# Patient Record
Sex: Male | Born: 2000 | Race: Black or African American | Hispanic: No | Marital: Single | Smoking: Never smoker
Health system: Southern US, Community
[De-identification: ages and names within clinical notes are randomized; demographics above are authoritative.]

## PROBLEM LIST (undated history)

## (undated) DIAGNOSIS — L309 Dermatitis, unspecified: Secondary | ICD-10-CM

## (undated) DIAGNOSIS — J45909 Unspecified asthma, uncomplicated: Secondary | ICD-10-CM

## (undated) DIAGNOSIS — T7840XA Allergy, unspecified, initial encounter: Secondary | ICD-10-CM

## (undated) DIAGNOSIS — Z87898 Personal history of other specified conditions: Secondary | ICD-10-CM

## (undated) HISTORY — DX: Personal history of other specified conditions: Z87.898

## (undated) HISTORY — DX: Allergy, unspecified, initial encounter: T78.40XA

## (undated) HISTORY — DX: Dermatitis, unspecified: L30.9

## (undated) HISTORY — DX: Unspecified asthma, uncomplicated: J45.909

---
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
## 2022-01-02 ENCOUNTER — Other Ambulatory Visit: Payer: Self-pay | Admitting: Pediatrics

## 2022-01-08 DIAGNOSIS — M25522 Pain in left elbow: Secondary | ICD-10-CM | POA: Diagnosis not present

## 2022-02-02 ENCOUNTER — Ambulatory Visit: Payer: Medicaid Other | Attending: Orthopedic Surgery | Admitting: Physical Therapy

## 2022-02-02 DIAGNOSIS — M25622 Stiffness of left elbow, not elsewhere classified: Secondary | ICD-10-CM | POA: Insufficient documentation

## 2022-02-02 DIAGNOSIS — M6281 Muscle weakness (generalized): Secondary | ICD-10-CM | POA: Diagnosis present

## 2022-02-02 DIAGNOSIS — G8929 Other chronic pain: Secondary | ICD-10-CM | POA: Diagnosis present

## 2022-02-02 DIAGNOSIS — M25612 Stiffness of left shoulder, not elsewhere classified: Secondary | ICD-10-CM | POA: Insufficient documentation

## 2022-02-02 DIAGNOSIS — M25512 Pain in left shoulder: Secondary | ICD-10-CM | POA: Diagnosis present

## 2022-02-02 NOTE — Therapy (Signed)
OUTPATIENT PHYSICAL THERAPY EVALUATION   Patient Name: Patrick Holloway MRN: [id number] DOB:[birth year], 21 y.o., male Today's Date: 02/02/2022   PT End of Session - 02/03/22 1335     Visit Number 1    Number of Visits 13    Date for PT Re-Evaluation 03/16/22    Authorization Type [employer] MCD    PT Start Time 1615    PT Stop Time 1700    PT Time Calculation (min) 45 min    Activity Tolerance Patient tolerated treatment well    Behavior During Therapy WFL for tasks assessed/performed             Past Medical History:  Diagnosis Date   Allergy    seasonal   Asthma    Eczema    History of Erb's palsy    History reviewed. No pertinent surgical history. There are no problems to display for this patient.   PCP: Myles Gip, DO  REFERRING PROVIDER: Jones Broom, MD  REFERRING DIAG: Contracture of elbow  THERAPY DIAG:  Stiffness of left elbow, not elsewhere classified  Stiffness of left shoulder, not elsewhere classified  Chronic left shoulder pain  Muscle weakness (generalized)  Rationale for Evaluation and Treatment Rehabilitation  ONSET DATE: patient reports ongoing for entire life (2004)   SUBJECTIVE:      SUBJECTIVE STATEMENT: Patient states he has some pains in his shoulder and stiffness in his elbow. 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 Patient is right handed. Patient feels like he is limited with activities such as basketball or swimming where he has to stretch his arm out in front or lift and reach.  PERTINENT HISTORY: Erb's palsy  PAIN:  Are you having pain? Yes:  NPRS scale: 0/10 (4/10 pain when lifting with left arm) Pain location: Left shoulder Pain description: Intermittent, pressure, aching Aggravating factors: Sleeping when laying on left shoulder, moving the left shoulder a lot, reaching, lifting Relieving factors: Rest, Tylenol  PRECAUTIONS: None  WEIGHT BEARING RESTRICTIONS No  FALLS:  Has patient fallen in last 6 months?  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  PLOF: Independent  PATIENT GOALS: Improve left elbow extension and LUE strength to improve use of left arm with school related extracurricular activities that involve reaching and lifting   OBJECTIVE:  PATIENT SURVEYS:  Quick Dash 36.4% disability  COGNITION: Overall cognitive status: Within functional limits for tasks assessed     SENSATION: Patient reports sensation deficits of the left arm from Erb's palsy  POSTURE: Rounded shoulders, left shoulder elevated with anterior scapular winging, left arm internally rotated, elbow and wrist flexed  UPPER EXTREMITY ROM:   Active ROM Right eval Left eval  Shoulder flexion 160 60  Shoulder abduction 170 45  Shoulder external rotation 80 80  Elbow flexion 150 150  Elbow extension 0 -60   UPPER EXTREMITY MMT: Patient demonstrates significant global strength deficits of the left shoulder, elbow, and wrist/hand that are consistent with diagnosis of Erb's palsy.   PALPATION:  Non tender to palpation    TODAY'S TREATMENT:  Seated banded elbow flexion and extension using yellow x 10 each Double ER and scap retraction with yellow x 10 Row with yellow x 10 Supine and seated hands clasped shoulder flexion stretch x 5 each Standing step back stretch using chair for shoulder flexion x 5 Self PROM for elbow extension stretch x 5 Supine LLLD stretch for  elbow extension using 2#   PATIENT EDUCATION: Education details: Exam findings, POC, HEP Person educated: Patient Education method: Explanation, Demonstration, Tactile cues, Verbal cues, and Handouts Education comprehension: verbalized understanding, returned demonstration, verbal cues required, tactile cues required, and needs further education  HOME EXERCISE PROGRAM: Access Code: [email or web address]   ASSESSMENT: CLINICAL IMPRESSION: Patient is a 21  y.o. 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 His main impairments include a 60 deg limitation in elbow extension and global strength deficits leading to postural deviations and increased left shoulder pain with activities including lifting and carrying. Patient is a Consulting civil engineer and feels limited with school related extracurricular activities due to his impairments.    OBJECTIVE IMPAIRMENTS decreased ROM, decreased strength, impaired flexibility, impaired sensation, improper body mechanics, postural dysfunction, and pain.   ACTIVITY LIMITATIONS carrying, lifting, and reach over head  PARTICIPATION LIMITATIONS: community activity and school  PERSONAL FACTORS Past/current experiences and Time since onset of injury/illness/exacerbation are also affecting patient's functional outcome.   REHAB POTENTIAL: Good  CLINICAL DECISION MAKING: Stable/uncomplicated  EVALUATION COMPLEXITY: Low   GOALS: Goals reviewed with patient? Yes  SHORT TERM GOALS: Target date: 03/02/2022  Patient will be I with initial HEP in order to progress with therapy. Baseline: HEP provided at eval Goal status: INITIAL  2.  Patient will improve right elbow extension by 5 deg (55 deg deficit) to improve forward reach and indicate improved elbow mobility. Baseline: Lacking 60 deg of elbow extension Goal status: INITIAL  LONG TERM GOALS: Target date: 03/16/2022   Patient will be I with final HEP to maintain progress from PT. Baseline: HEP provided at eval Goal status: INITIAL  2.  Patient will report </= 25% on Quick DASH in order to indicate improvement in functional ability. Baseline: 36.4% Goal status: INITIAL  3.  Patient will improve left elbow AROM to 45-150 degrees in order to improve reaching tasks and participation in school related extracurricular activities. Baseline: Left elbow AROM 60-150 deg Goal  status: INITIAL  4.  Patient will demonstrate improve postural control and report no increased left shoulder pain with lifting or carrying tasks in order to improve ability to lift or carry book bag for school. Baseline: Patient demonstrates rounded shoulder posture and 4/10 left shoulder pain when lifting with the left arm Goal status: INITIAL   PLAN: PT FREQUENCY: 1-2x/week  PT DURATION: 6 weeks  PLANNED INTERVENTIONS: Therapeutic exercises, Therapeutic activity, Neuromuscular re-education, Balance training, Gait training, Patient/Family education, Joint manipulation, Joint mobilization, Aquatic Therapy, Dry Needling, Electrical stimulation, Cryotherapy, Moist heat, Taping, Manual therapy, and Re-evaluation  PLAN FOR NEXT SESSION: Review HEP and progress PRN, manual and stretching for left elbow extension and shoulder flexion, progress strength for postural control and lifting/carrying ability, grip strengthening   Rosana Hoes, PT, DPT, LAT, ATC 02/03/22  4:02 PM Phone: [phone] Fax: [phone]   Check all possible CPT codes: 83419 - PT Re-evaluation, 97110- Therapeutic Exercise, [phone]- Neuro Re-education, 97140 - Manual Therapy, 97530 - Therapeutic Activities, 97535 - Self Care, 97014 - Electrical stimulation (unattended), and [id number] - Electrical stimulation (Manual)     If treatment provided at initial evaluation, no treatment charged due to lack of authorization.

## 2022-02-03 ENCOUNTER — Other Ambulatory Visit: Payer: Self-pay

## 2022-02-03 ENCOUNTER — Encounter: Payer: Self-pay | Admitting: Physical Therapy

## 2022-02-11 ENCOUNTER — Ambulatory Visit: Payer: Medicaid Other | Attending: Orthopedic Surgery

## 2022-02-11 DIAGNOSIS — M25612 Stiffness of left shoulder, not elsewhere classified: Secondary | ICD-10-CM | POA: Diagnosis present

## 2022-02-11 DIAGNOSIS — M25622 Stiffness of left elbow, not elsewhere classified: Secondary | ICD-10-CM | POA: Insufficient documentation

## 2022-02-11 DIAGNOSIS — M25512 Pain in left shoulder: Secondary | ICD-10-CM | POA: Insufficient documentation

## 2022-02-11 DIAGNOSIS — M6281 Muscle weakness (generalized): Secondary | ICD-10-CM | POA: Insufficient documentation

## 2022-02-11 DIAGNOSIS — G8929 Other chronic pain: Secondary | ICD-10-CM | POA: Diagnosis present

## 2022-02-11 NOTE — Therapy (Signed)
OUTPATIENT PHYSICAL THERAPY TREATMENT NOTE   Patient Name: Patrick Holloway MRN: [id number] DOB:Jul 26, 2001, 21 y.o., male Today's Date: 02/11/2022  PCP: Myles Gip, DO REFERRING PROVIDER: Jones Broom, MD  END OF SESSION:   PT End of Session - 02/11/22 1702     Visit Number 2    Number of Visits 13    Date for PT Re-Evaluation 03/16/22    Authorization Type [employer] MCD- auth pending    PT Start Time 1702    PT Stop Time 1743    PT Time Calculation (min) 41 min    Activity Tolerance Patient tolerated treatment well    Behavior During Therapy WFL for tasks assessed/performed             Past Medical History:  Diagnosis Date   Allergy    seasonal   Asthma    Eczema    History of Erb's palsy    History reviewed. No pertinent surgical history. There are no problems to display for this patient.   REFERRING DIAG:  Contracture of elbow  THERAPY DIAG:  Stiffness of left elbow, not elsewhere classified  Stiffness of left shoulder, not elsewhere classified  Chronic left shoulder pain  Muscle weakness (generalized)  Rationale for Evaluation and Treatment Rehabilitation  PERTINENT HISTORY: Erb's Palsy  PRECAUTIONS: none   SUBJECTIVE: Patient reports he is doing well without current reports of pain. He reports compliance with HEP.   PAIN:  Are you having pain? No   OBJECTIVE: (objective measures completed at initial evaluation unless otherwise dated)  PATIENT SURVEYS:  Quick Dash 36.4% disability   COGNITION: Overall cognitive status: Within functional limits for tasks assessed                                  SENSATION: Patient reports sensation deficits of the left arm from Erb's palsy   POSTURE: Rounded shoulders, left shoulder elevated with anterior scapular winging, left arm internally rotated, elbow and wrist flexed   UPPER EXTREMITY ROM:    Active ROM Right eval Left eval 02/11/22  Shoulder flexion 160 60   Shoulder abduction 170 45    Shoulder external rotation 80 80   Elbow flexion 150 150   Elbow extension 0 -60 Lacking 50     UPPER EXTREMITY MMT: Patient demonstrates significant global strength deficits of the left shoulder, elbow, and wrist/hand that are consistent with diagnosis of Erb's palsy.    PALPATION:  Non tender to palpation               TODAY'S TREATMENT:  [facility] Adult PT Treatment:                                                DATE: 02/11/22 Therapeutic Exercise: Elbow flexor stretch at wall 2 x 30 sec  Wall pushup partial range 1 x 10  Resisted pronation/supination with yellow therabar 1 x 10 each  Sidelying shoulder flexion LUE 1 x 10 Supine shoulder flexion with stability ball 1 x 10  Stool rotations with LUE (shoulder IR/ER) 1 x10 each  Pulleys flexion 2 minutes  Childs pose x 60 seconds  Manual Therapy: Lt elbow and shoulder flexion stretching Lt Elbow distraction with strap STM Lt biceps brachii Lt elbow pronation/supination PROM   Initial evaluation treatment  Seated banded elbow flexion and extension using yellow x 10 each Double ER and scap retraction with yellow x 10 Row with yellow x 10 Supine and seated hands clasped shoulder flexion stretch x 5 each Standing step back stretch using chair for shoulder flexion x 5 Self PROM for elbow extension stretch x 5 Supine LLLD stretch for elbow extension using 2#     PATIENT EDUCATION: Education details: N/A Person educated: N/A Education method:N/A Education comprehension: N/A   HOME EXERCISE PROGRAM: Access Code: [email or web address]     ASSESSMENT: CLINICAL IMPRESSION:   Patient tolerated session well today focusing on improving elbow extension ROM. His elbow extension AROM has improved by 10 degrees compared to initial evaluation, though remains significantly limited at this time, lacking 50 degrees. Able to progress shoulder and elbow strengthening without reports of pain, though given his Erb's Palsy, some compensation present with all  strengthening.    OBJECTIVE IMPAIRMENTS decreased ROM, decreased strength, impaired flexibility, impaired sensation, improper body mechanics, postural dysfunction, and pain.    ACTIVITY LIMITATIONS carrying, lifting, and reach over head   PARTICIPATION LIMITATIONS: community activity and school   PERSONAL FACTORS Past/current experiences and Time since onset of injury/illness/exacerbation are also affecting patient's functional outcome.    REHAB POTENTIAL: Good   CLINICAL DECISION MAKING: Stable/uncomplicated   EVALUATION COMPLEXITY: Low     GOALS: Goals reviewed with patient? Yes   SHORT TERM GOALS: Target date: 03/02/2022   Patient will be I with initial HEP in order to progress with therapy. Baseline: HEP provided at eval Goal status: INITIAL   2.  Patient will improve right elbow extension by 5 deg (55 deg deficit) to improve forward reach and indicate improved elbow mobility. Baseline: Lacking 60 deg of elbow extension Goal status: INITIAL   LONG TERM GOALS: Target date: 03/16/2022    Patient will be I with final HEP to maintain progress from PT. Baseline: HEP provided at eval Goal status: INITIAL   2.  Patient will report </= 25% on Quick DASH in order to indicate improvement in functional ability. Baseline: 36.4% Goal status: INITIAL   3.  Patient will improve left elbow AROM to 45-150 degrees in order to improve reaching tasks and participation in school related extracurricular activities. Baseline: Left elbow AROM 60-150 deg Goal status: INITIAL   4.  Patient will demonstrate improve postural control and report no increased left shoulder pain with lifting or carrying tasks in order to improve ability to lift or carry book bag for school. Baseline: Patient demonstrates rounded shoulder posture and 4/10 left shoulder pain when lifting with the left arm Goal status: INITIAL     PLAN: PT FREQUENCY: 1-2x/week   PT DURATION: 6 weeks   PLANNED INTERVENTIONS:  Therapeutic exercises, Therapeutic activity, Neuromuscular re-education, Balance training, Gait training, Patient/Family education, Joint manipulation, Joint mobilization, Aquatic Therapy, Dry Needling, Electrical stimulation, Cryotherapy, Moist heat, Taping, Manual therapy, and Re-evaluation   PLAN FOR NEXT SESSION: Review HEP and progress PRN, manual and stretching for left elbow extension and shoulder flexion, progress strength for postural control and lifting/carrying ability, grip strengthening     Letitia Libra, PT, DPT, ATC 02/11/22 5:45 PM

## 2022-02-15 ENCOUNTER — Ambulatory Visit: Payer: Medicaid Other

## 2022-02-17 ENCOUNTER — Ambulatory Visit: Payer: Medicaid Other

## 2022-02-17 DIAGNOSIS — M25622 Stiffness of left elbow, not elsewhere classified: Secondary | ICD-10-CM

## 2022-02-17 DIAGNOSIS — M6281 Muscle weakness (generalized): Secondary | ICD-10-CM

## 2022-02-17 DIAGNOSIS — M25612 Stiffness of left shoulder, not elsewhere classified: Secondary | ICD-10-CM

## 2022-02-17 DIAGNOSIS — G8929 Other chronic pain: Secondary | ICD-10-CM

## 2022-02-17 NOTE — Patient Instructions (Signed)

## 2022-02-17 NOTE — Therapy (Signed)
OUTPATIENT PHYSICAL THERAPY TREATMENT NOTE   Patient Name: Patrick Holloway MRN: [id number] DOB:05-Mar-2001, 21 y.o., male Today's Date: 02/17/2022  PCP: Myles Gip, DO REFERRING PROVIDER: Jones Broom, MD  END OF SESSION:   PT End of Session - 02/17/22 1616     Visit Number 3    Number of Visits 13    Date for PT Re-Evaluation 03/16/22    Authorization Type [employer] MCD- auth pending    PT Start Time 1616    PT Stop Time 1658    PT Time Calculation (min) 42 min    Activity Tolerance Patient tolerated treatment well    Behavior During Therapy WFL for tasks assessed/performed             Past Medical History:  Diagnosis Date   Allergy    seasonal   Asthma    Eczema    History of Erb's palsy    History reviewed. No pertinent surgical history. There are no problems to display for this patient.   REFERRING DIAG:  Contracture of elbow  THERAPY DIAG:  Stiffness of left elbow, not elsewhere classified  Stiffness of left shoulder, not elsewhere classified  Chronic left shoulder pain  Muscle weakness (generalized)  Rationale for Evaluation and Treatment Rehabilitation  PERTINENT HISTORY: Erb's Palsy  PRECAUTIONS: none   SUBJECTIVE: Patient reports he is doing good without reports of pain. He reports compliance with HEP.   PAIN:  Are you having pain? No   OBJECTIVE: (objective measures completed at initial evaluation unless otherwise dated)  PATIENT SURVEYS:  Quick Dash 36.4% disability   COGNITION: Overall cognitive status: Within functional limits for tasks assessed                                  SENSATION: Patient reports sensation deficits of the left arm from Erb's palsy   POSTURE: Rounded shoulders, left shoulder elevated with anterior scapular winging, left arm internally rotated, elbow and wrist flexed   UPPER EXTREMITY ROM:    Active ROM Right eval Left eval 02/11/22 02/17/22  Shoulder flexion 160 60    Shoulder abduction 170 45     Shoulder external rotation 80 80    Elbow flexion 150 150    Elbow extension 0 -60 Lacking 50  Lacking 45     UPPER EXTREMITY MMT: Patient demonstrates significant global strength deficits of the left shoulder, elbow, and wrist/hand that are consistent with diagnosis of Erb's palsy.    PALPATION:  Non tender to palpation               TODAY'S TREATMENT:  [facility] Adult PT Treatment:                                                DATE: 02/17/22 Therapeutic Exercise: UBE x 4 minutes for ROM (no resistance) Tricep extension with pilates springboard 2 x 10 Stool rotations with LUE (shoulder IR/ER) 1 x10 each  Chest press eccentric with pilates springboard 1 x 10  Manual Therapy: Lt elbow and shoulder flexion/abduction PROM to tolerance  STM/DTM Lt biceps brachii Lt elbow pronation/supination PROM  Trigger Point Dry Needling Treatment: Pre-treatment instruction: Patient instructed on dry needling rationale, procedures, and possible side effects including pain during treatment (achy,cramping feeling), bruising, drop of blood, lightheadedness,  nausea, sweating. Patient Consent Given: Yes Education handout provided: Yes Muscles treated: Lt bicep brachii   Treatment response/outcome: Twitch response elicited and Palpable decrease in muscle tension Post-treatment instructions: Patient instructed to expect possible mild to moderate muscle soreness later today and/or tomorrow. Patient instructed in methods to reduce muscle soreness and to continue prescribed HEP. If patient was dry needled over the lung field, patient was instructed on signs and symptoms of pneumothorax and, however unlikely, to see immediate medical attention should they occur. Patient was also educated on signs and symptoms of infection and to seek medical attention should they occur. Patient verbalized understanding of these instructions and education.   [facility] Adult PT Treatment:                                                 DATE: 02/11/22 Therapeutic Exercise: Elbow flexor stretch at wall 2 x 30 sec  Wall pushup partial range 1 x 10  Resisted pronation/supination with yellow therabar 1 x 10 each  Sidelying shoulder flexion LUE 1 x 10 Supine shoulder flexion with stability ball 1 x 10  Stool rotations with LUE (shoulder IR/ER) 1 x10 each  Pulleys flexion 2 minutes  Childs pose x 60 seconds  Manual Therapy: Lt elbow and shoulder flexion stretching Lt Elbow distraction with strap STM Lt biceps brachii Lt elbow pronation/supination PROM   Initial evaluation treatment  Seated banded elbow flexion and extension using yellow x 10 each Double ER and scap retraction with yellow x 10 Row with yellow x 10 Supine and seated hands clasped shoulder flexion stretch x 5 each Standing step back stretch using chair for shoulder flexion x 5 Self PROM for elbow extension stretch x 5 Supine LLLD stretch for elbow extension using 2#     PATIENT EDUCATION: Education details: N/A Person educated: N/A Education method:N/A Education comprehension: N/A   HOME EXERCISE PROGRAM: Access Code: [email or web address]     ASSESSMENT: CLINICAL IMPRESSION: Patient tolerated session well today with continued emphasis on improving Lt elbow and shoulder mobility and strength. TPDN was performed to the bicep brachii with excellent twitch response elicited. Slight improvement in elbow extension AROM noted today compared to last session. He tolerated ROM and strengthening well today without reports of pain, though muscle compensation is present with majority of strengthening secondary to Erb's Palsy.    OBJECTIVE IMPAIRMENTS decreased ROM, decreased strength, impaired flexibility, impaired sensation, improper body mechanics, postural dysfunction, and pain.    ACTIVITY LIMITATIONS carrying, lifting, and reach over head   PARTICIPATION LIMITATIONS: community activity and school   PERSONAL FACTORS Past/current experiences and Time since onset of  injury/illness/exacerbation are also affecting patient's functional outcome.    REHAB POTENTIAL: Good   CLINICAL DECISION MAKING: Stable/uncomplicated   EVALUATION COMPLEXITY: Low     GOALS: Goals reviewed with patient? Yes   SHORT TERM GOALS: Target date: 03/02/2022   Patient will be I with initial HEP in order to progress with therapy. Baseline: HEP provided at eval Goal status: achieved    2.  Patient will improve right elbow extension by 5 deg (55 deg deficit) to improve forward reach and indicate improved elbow mobility. Baseline: Lacking 60 deg of elbow extension Goal status: achieved    LONG TERM GOALS: Target date: 03/16/2022    Patient will be I with final HEP to maintain progress from PT. Baseline:  HEP provided at eval Goal status: INITIAL   2.  Patient will report </= 25% on Quick DASH in order to indicate improvement in functional ability. Baseline: 36.4% Goal status: INITIAL   3.  Patient will improve left elbow AROM to 45-150 degrees in order to improve reaching tasks and participation in school related extracurricular activities. Baseline: Left elbow AROM 60-150 deg Goal status: INITIAL   4.  Patient will demonstrate improve postural control and report no increased left shoulder pain with lifting or carrying tasks in order to improve ability to lift or carry book bag for school. Baseline: Patient demonstrates rounded shoulder posture and 4/10 left shoulder pain when lifting with the left arm Goal status: INITIAL     PLAN: PT FREQUENCY: 1-2x/week   PT DURATION: 6 weeks   PLANNED INTERVENTIONS: Therapeutic exercises, Therapeutic activity, Neuromuscular re-education, Balance training, Gait training, Patient/Family education, Joint manipulation, Joint mobilization, Aquatic Therapy, Dry Needling, Electrical stimulation, Cryotherapy, Moist heat, Taping, Manual therapy, and Re-evaluation   PLAN FOR NEXT SESSION: Review HEP and progress PRN, manual and stretching  for left elbow extension and shoulder flexion, progress strength for postural control and lifting/carrying ability, grip strengthening     Letitia Libra, PT, DPT, ATC 02/17/22 5:00 PM

## 2022-02-22 ENCOUNTER — Ambulatory Visit: Payer: Medicaid Other | Admitting: Physical Therapy

## 2022-02-23 ENCOUNTER — Telehealth: Payer: Self-pay | Admitting: Physical Therapy

## 2022-02-23 NOTE — Telephone Encounter (Signed)
Attempted to contact patient regarding missed PT appointment. Left VM informing him of missed appointment, next scheduled appointment, and attendance policy.   Rosana Hoes, PT, DPT, LAT, ATC 02/23/22  1:51 PM Phone: [phone] Fax: [phone]

## 2022-02-23 NOTE — Therapy (Addendum)
OUTPATIENT PHYSICAL THERAPY TREATMENT NOTE  PHYSICAL THERAPY DISCHARGE SUMMARY  Visits from Start of Care: 4  Current functional level related to goals / functional outcomes: See goals below   Remaining deficits: Status unknown   Education / Equipment: N/a   Patient agrees to discharge. Patient goals were partially met. Patient is being discharged due to not returning since the last visit.    Patient Name: Patrick Holloway MRN: [id number] DOB:Mar 22, 2001, 21 y.o., male Today's Date: 02/24/2022  PCP: Kristen Loader, DO REFERRING PROVIDER: Tania Ade, MD  END OF SESSION:   PT End of Session - 02/24/22 1551     Visit Number 4    Number of Visits 13    Date for PT Re-Evaluation 03/16/22    Authorization Type [employer] MCD- auth pending    Authorization Time Period 7/6-8/18/23    Authorization - Number of Visits 12    PT Start Time 3419   patient late   PT Stop Time 1615    PT Time Calculation (min) 24 min    Activity Tolerance Patient tolerated treatment well    Behavior During Therapy WFL for tasks assessed/performed              Past Medical History:  Diagnosis Date   Allergy    seasonal   Asthma    Eczema    History of Erb's palsy    History reviewed. No pertinent surgical history. There are no problems to display for this patient.   REFERRING DIAG:  Contracture of elbow  THERAPY DIAG:  Stiffness of left elbow, not elsewhere classified  Stiffness of left shoulder, not elsewhere classified  Chronic left shoulder pain  Muscle weakness (generalized)  Rationale for Evaluation and Treatment Rehabilitation  PERTINENT HISTORY: Erb's Palsy  PRECAUTIONS: none   SUBJECTIVE: Patient reports he is feeling good without pain. He reports completing HEP daily.   PAIN:  Are you having pain? No   OBJECTIVE: (objective measures completed at initial evaluation unless otherwise dated)  PATIENT SURVEYS:  Quick Dash 36.4% disability    COGNITION: Overall cognitive status: Within functional limits for tasks assessed                                  SENSATION: Patient reports sensation deficits of the left arm from Erb's palsy   POSTURE: Rounded shoulders, left shoulder elevated with anterior scapular winging, left arm internally rotated, elbow and wrist flexed   UPPER EXTREMITY ROM:    Active ROM Right eval Left eval 02/11/22 02/17/22 02/24/22  Shoulder flexion 160 60     Shoulder abduction 170 45     Shoulder external rotation 80 80     Elbow flexion 150 150     Elbow extension 0 -60 Lacking 50  Lacking 45  Lacking 45     UPPER EXTREMITY MMT: Patient demonstrates significant global strength deficits of the left shoulder, elbow, and wrist/hand that are consistent with diagnosis of Erb's palsy.    PALPATION:  Non tender to palpation               TODAY'S TREATMENT:  [facility] Adult PT Treatment:                                                DATE: 02/24/22  Therapeutic Exercise: Sidelying ER 2 x 10; 3# LUE Prone shoulder extension 2 x 10 LUE Shoulder taps at wall 1 x 10 Prone T LUE 2 x 10  Updated HEP Manual Therapy: Lt elbow extension and shoulder flexion PROM to tolerance  Lt elbow pronation/supination PROM    [facility] Adult PT Treatment:                                                DATE: 02/17/22 Therapeutic Exercise: UBE x 4 minutes for ROM (no resistance) Tricep extension with pilates springboard 2 x 10 Stool rotations with LUE (shoulder IR/ER) 1 x10 each  Chest press eccentric with pilates springboard 1 x 10  Manual Therapy: Lt elbow and shoulder flexion/abduction PROM to tolerance  STM/DTM Lt biceps brachii Lt elbow pronation/supination PROM  Trigger Point Dry Needling Treatment: Pre-treatment instruction: Patient instructed on dry needling rationale, procedures, and possible side effects including pain during treatment (achy,cramping feeling), bruising, drop of blood, lightheadedness, nausea,  sweating. Patient Consent Given: Yes Education handout provided: Yes Muscles treated: Lt bicep brachii   Treatment response/outcome: Twitch response elicited and Palpable decrease in muscle tension Post-treatment instructions: Patient instructed to expect possible mild to moderate muscle soreness later today and/or tomorrow. Patient instructed in methods to reduce muscle soreness and to continue prescribed HEP. If patient was dry needled over the lung field, patient was instructed on signs and symptoms of pneumothorax and, however unlikely, to see immediate medical attention should they occur. Patient was also educated on signs and symptoms of infection and to seek medical attention should they occur. Patient verbalized understanding of these instructions and education.   [facility] Adult PT Treatment:                                                DATE: 02/11/22 Therapeutic Exercise: Elbow flexor stretch at wall 2 x 30 sec  Wall pushup partial range 1 x 10  Resisted pronation/supination with yellow therabar 1 x 10 each  Sidelying shoulder flexion LUE 1 x 10 Supine shoulder flexion with stability ball 1 x 10  Stool rotations with LUE (shoulder IR/ER) 1 x10 each  Pulleys flexion 2 minutes  Childs pose x 60 seconds  Manual Therapy: Lt elbow and shoulder flexion stretching Lt Elbow distraction with strap STM Lt biceps brachii Lt elbow pronation/supination PROM       PATIENT EDUCATION: Education details: HEP Person educated: patient Education method:demo, handout, cues Education comprehension: returned demo, cues    HOME EXERCISE PROGRAM: Access Code: [email or web address]     ASSESSMENT: CLINICAL IMPRESSION:  Session was limited as patient was late for scheduled visit. Elbow extension AROM remains unchanged from last visit. Focused on shoulder and periscapular strengthening today, which he tolerated well with some compensatory movement present secondary to Erb's palsy. He has the most difficulty with  periscapular strengthening with ability to complete strengthening through partial range.    OBJECTIVE IMPAIRMENTS decreased ROM, decreased strength, impaired flexibility, impaired sensation, improper body mechanics, postural dysfunction, and pain.    ACTIVITY LIMITATIONS carrying, lifting, and reach over head   PARTICIPATION LIMITATIONS: community activity and school   PERSONAL FACTORS Past/current experiences and Time since onset of injury/illness/exacerbation are also affecting patient's functional outcome.  REHAB POTENTIAL: Good   CLINICAL DECISION MAKING: Stable/uncomplicated   EVALUATION COMPLEXITY: Low     GOALS: Goals reviewed with patient? Yes   SHORT TERM GOALS: Target date: 03/02/2022   Patient will be I with initial HEP in order to progress with therapy. Baseline: HEP provided at eval Goal status: achieved    2.  Patient will improve right elbow extension by 5 deg (55 deg deficit) to improve forward reach and indicate improved elbow mobility. Baseline: Lacking 60 deg of elbow extension Goal status: achieved    LONG TERM GOALS: Target date: 03/16/2022    Patient will be I with final HEP to maintain progress from PT. Baseline: HEP provided at eval Goal status: INITIAL   2.  Patient will report </= 25% on Quick DASH in order to indicate improvement in functional ability. Baseline: 36.4% Goal status: INITIAL   3.  Patient will improve left elbow AROM to 45-150 degrees in order to improve reaching tasks and participation in school related extracurricular activities. Baseline: Left elbow AROM 60-150 deg Goal status: INITIAL   4.  Patient will demonstrate improve postural control and report no increased left shoulder pain with lifting or carrying tasks in order to improve ability to lift or carry book bag for school. Baseline: Patient demonstrates rounded shoulder posture and 4/10 left shoulder pain when lifting with the left arm Goal status: INITIAL     PLAN: PT  FREQUENCY: 1-2x/week   PT DURATION: 6 weeks   PLANNED INTERVENTIONS: Therapeutic exercises, Therapeutic activity, Neuromuscular re-education, Balance training, Gait training, Patient/Family education, Joint manipulation, Joint mobilization, Aquatic Therapy, Dry Needling, Electrical stimulation, Cryotherapy, Moist heat, Taping, Manual therapy, and Re-evaluation   PLAN FOR NEXT SESSION: Review HEP and progress PRN, manual and stretching for left elbow extension and shoulder flexion, progress strength for postural control and lifting/carrying ability, grip strengthening     Gwendolyn Grant, PT, DPT, ATC 02/24/22 4:15 PM  Gwendolyn Grant, PT, DPT, ATC 04/29/22 2:39 PM

## 2022-02-24 ENCOUNTER — Ambulatory Visit: Payer: Medicaid Other

## 2022-02-24 DIAGNOSIS — G8929 Other chronic pain: Secondary | ICD-10-CM

## 2022-02-24 DIAGNOSIS — M25612 Stiffness of left shoulder, not elsewhere classified: Secondary | ICD-10-CM

## 2022-02-24 DIAGNOSIS — M25622 Stiffness of left elbow, not elsewhere classified: Secondary | ICD-10-CM

## 2022-02-24 DIAGNOSIS — M6281 Muscle weakness (generalized): Secondary | ICD-10-CM

## 2022-03-12 DIAGNOSIS — H5213 Myopia, bilateral: Secondary | ICD-10-CM | POA: Diagnosis not present

## 2022-03-22 ENCOUNTER — Encounter: Payer: Self-pay | Admitting: Pediatrics

## 2022-03-30 DIAGNOSIS — Z23 Encounter for immunization: Secondary | ICD-10-CM | POA: Diagnosis not present

## 2022-05-20 ENCOUNTER — Ambulatory Visit (INDEPENDENT_AMBULATORY_CARE_PROVIDER_SITE_OTHER): Payer: Medicaid Other | Admitting: Orthopaedic Surgery

## 2022-05-20 ENCOUNTER — Encounter: Payer: Self-pay | Admitting: Orthopaedic Surgery

## 2022-05-20 ENCOUNTER — Ambulatory Visit (INDEPENDENT_AMBULATORY_CARE_PROVIDER_SITE_OTHER): Payer: Medicaid Other

## 2022-05-20 DIAGNOSIS — G8929 Other chronic pain: Secondary | ICD-10-CM

## 2022-05-20 DIAGNOSIS — M25512 Pain in left shoulder: Secondary | ICD-10-CM

## 2022-05-20 NOTE — Progress Notes (Signed)
The patient is a 21 year old gentleman sent from his primary care physician to evaluate and treat a chronic left shoulder issue.  His mother is with him today as well.  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  Apparently he was taken to surgery about 9 months later for surgery on the left upper extremity as it relates to his Erb's palsy.  He actually has a plate and screws in the clavicle from that surgery on the left side.  He now does have chronic pain around his shoulder and clavicle area and has been going to physical therapy but he does report painful range of motion of the left shoulder and weakness in general of his left upper extremity which is chronic.  He is right-hand dominant.  He is a Ship broker at [employer].  He denies any other significant medical issues at all.  I had him remove his shirt on inspection he can see that his left upper extremity is smaller than the right upper extremity.  The shoulder is clinically well located and his external rotation is almost full.  His abduction as well as reaching behind him is limited.  He has significant scarring around his clavicle from previous surgery.  He has a weak grip and pinch strength on the left side.  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  X-rays of his shoulder show the shoulder is well located on the left side and there is a retained clavicle plate and screws.  I talked to the patient and his mother about his situation.  This is significantly complex given his history of Erb's palsy and the chronic issues he has as the result of that birth injury.  This is something that is complex enough that I will would like to send him to Dr. Roberts Gaudy at [facility] orthopedics for further evaluation and treatment of this complex upper extremity issue.  The patient and his mother agree with this referral as well.

## 2022-05-21 ENCOUNTER — Other Ambulatory Visit: Payer: Self-pay

## 2022-05-21 DIAGNOSIS — G8929 Other chronic pain: Secondary | ICD-10-CM

## 2022-06-28 DIAGNOSIS — M24522 Contracture, left elbow: Secondary | ICD-10-CM | POA: Diagnosis not present

## 2022-06-28 DIAGNOSIS — G8929 Other chronic pain: Secondary | ICD-10-CM | POA: Diagnosis not present

## 2022-06-28 DIAGNOSIS — M25512 Pain in left shoulder: Secondary | ICD-10-CM | POA: Diagnosis not present

## 2022-07-14 ENCOUNTER — Ambulatory Visit (INDEPENDENT_AMBULATORY_CARE_PROVIDER_SITE_OTHER): Payer: Medicaid Other | Admitting: Orthopaedic Surgery

## 2022-07-14 ENCOUNTER — Encounter: Payer: Self-pay | Admitting: Orthopaedic Surgery

## 2022-07-14 DIAGNOSIS — M25512 Pain in left shoulder: Secondary | ICD-10-CM

## 2022-07-14 DIAGNOSIS — G8929 Other chronic pain: Secondary | ICD-10-CM

## 2022-07-14 NOTE — Progress Notes (Signed)
The patient is a 21 year old male that I saw her recently.  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  He had surgery by a nerve specialist Dr. Laurice Record at [facility] when he was very young.  I recently sent him to a nerve and shoulder specialist over at [facility] and according to his mother, there is really nothing that they can do for him and said that he is actually doing well for what he had been through it is a chronic issue.  They had no recommendations.  She comes in today requesting that he qualify for full body disability for his shoulder issues.  I explained to her that it would be difficult for him to get full body disability given that he has normal function of his right upper extremity as well as his legs and feet.  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  He has normal mental capacity.  I explained that it would be difficult to obtain full body disability because we could not say that he has no ability to perform any type of even light physical demand labor or sedentary labor.  He is not a candidate for full body disability from an orthopedic standpoint but this is something she can certainly pursue with his primary care physician.

## 2022-07-19 DIAGNOSIS — L308 Other specified dermatitis: Secondary | ICD-10-CM | POA: Diagnosis not present

## 2022-07-19 DIAGNOSIS — L700 Acne vulgaris: Secondary | ICD-10-CM | POA: Diagnosis not present

## 2023-01-04 DIAGNOSIS — J302 Other seasonal allergic rhinitis: Secondary | ICD-10-CM | POA: Diagnosis not present

## 2023-01-04 DIAGNOSIS — L309 Dermatitis, unspecified: Secondary | ICD-10-CM | POA: Diagnosis not present

## 2023-01-04 DIAGNOSIS — Z0000 Encounter for general adult medical examination without abnormal findings: Secondary | ICD-10-CM | POA: Diagnosis not present

## 2023-01-04 DIAGNOSIS — J45909 Unspecified asthma, uncomplicated: Secondary | ICD-10-CM | POA: Diagnosis not present

## 2023-01-04 DIAGNOSIS — Z6830 Body mass index (BMI) 30.0-30.9, adult: Secondary | ICD-10-CM | POA: Diagnosis not present

## 2023-01-04 DIAGNOSIS — H04129 Dry eye syndrome of unspecified lacrimal gland: Secondary | ICD-10-CM | POA: Diagnosis not present

## 2023-04-19 ENCOUNTER — Encounter: Payer: Self-pay | Admitting: Pediatrics
